# Patient Record
Sex: Male | Born: 1995 | Race: Black or African American | Hispanic: No | Marital: Single | Smoking: Current some day smoker
Health system: Southern US, Community
[De-identification: ages and names within clinical notes are randomized; demographics above are authoritative.]

## PROBLEM LIST (undated history)

## (undated) DIAGNOSIS — J45909 Unspecified asthma, uncomplicated: Secondary | ICD-10-CM

## (undated) HISTORY — DX: Unspecified asthma, uncomplicated: J45.909

---

## 2018-07-08 ENCOUNTER — Emergency Department (HOSPITAL_COMMUNITY)
Admission: EM | Admit: 2018-07-08 | Discharge: 2018-07-08 | Disposition: A | Discharge status: Home / Self Care | Payer: Self-pay | Attending: Emergency Medicine | Admitting: Emergency Medicine

## 2018-07-08 ENCOUNTER — Emergency Department (HOSPITAL_COMMUNITY): Payer: Self-pay

## 2018-07-08 ENCOUNTER — Other Ambulatory Visit: Payer: Self-pay

## 2018-07-08 DIAGNOSIS — Y92810 Car as the place of occurrence of the external cause: Secondary | ICD-10-CM | POA: Insufficient documentation

## 2018-07-08 DIAGNOSIS — W25XXXA Contact with sharp glass, initial encounter: Secondary | ICD-10-CM | POA: Insufficient documentation

## 2018-07-08 DIAGNOSIS — Y999 Unspecified external cause status: Secondary | ICD-10-CM | POA: Insufficient documentation

## 2018-07-08 DIAGNOSIS — Y9389 Activity, other specified: Secondary | ICD-10-CM | POA: Insufficient documentation

## 2018-07-08 DIAGNOSIS — S61412A Laceration without foreign body of left hand, initial encounter: Secondary | ICD-10-CM | POA: Insufficient documentation

## 2018-07-08 DIAGNOSIS — S6992XA Unspecified injury of left wrist, hand and finger(s), initial encounter: Secondary | ICD-10-CM

## 2018-07-08 MED ORDER — TETANUS-DIPHTH-ACELL PERTUSSIS 5-2.5-18.5 LF-MCG/0.5 IM SUSP
0.5000 mL | Freq: Once | INTRAMUSCULAR | Status: AC
  Administered 2018-07-08: 20:00:00 0.5 mL via INTRAMUSCULAR
  Filled 2018-07-08: qty 0.5

## 2018-07-08 NOTE — ED Notes (Signed)
Patient transported to X-ray 

## 2018-07-08 NOTE — ED Provider Notes (Signed)
[facility] Provider Note   CSN: [id number] Arrival date & time: 07/08/18  1854    History   Chief Complaint Chief Complaint  Patient presents with  . Hand Injury    HPI Bobby Moore is a 23 y.o. male with a PMH of asthma presenting with a left hand injury onset 1 hour ago. Patient reports he was locked out of car and punched the window. Patient reports a small wound and bleeding on the dorsal aspect of his left hand. Patient denies numbness, paresthesias, or weakness. Patient denies hand pain, wrist pain, or any other injuries. Patient states he is right hand dominant. Patient states his last tetanus shot was in 6th grade. Patient reports tremors and feeling anxious. Patient denies fever, chills, shortness of breath, chest pain, cough, or congestion. Patient denies sick contacts or recent travel.     HPI  No past medical history on file.  There are no active problems to display for this patient.     Home Medications    Prior to Admission medications   Not on File    Family History No family history on file.  Social History Social History   Tobacco Use  . Smoking status: Not on file  Substance Use Topics  . Alcohol use: Not on file  . Drug use: Not on file     Allergies   Patient has no allergy information on record.   Review of Systems Review of Systems  Constitutional: Negative for chills, diaphoresis and fever.  Respiratory: Negative for cough and shortness of breath.   Cardiovascular: Negative for chest pain.  Gastrointestinal: Negative for abdominal pain, nausea and vomiting.  Endocrine: Negative for cold intolerance and heat intolerance.  Musculoskeletal: Negative for arthralgias.  Skin: Positive for wound. Negative for rash.  Allergic/Immunologic: Negative for immunocompromised state.  Neurological: Positive for tremors.  Hematological: Negative for adenopathy.  Psychiatric/Behavioral: The patient is  nervous/anxious.    Physical Exam Updated Vital Signs BP 106/61   Pulse 65   Temp 98.2 F (36.8 C) (Oral)   Resp 14   SpO2 100%   Physical Exam Vitals signs and nursing note reviewed.  Constitutional:      General: He is not in acute distress.    Appearance: He is well-developed. He is not diaphoretic.  HENT:     Head: Normocephalic and atraumatic.  Neck:     Musculoskeletal: Normal range of motion.  Cardiovascular:     Rate and Rhythm: Normal rate and regular rhythm.     Heart sounds: Normal heart sounds. No murmur. No friction rub. No gallop.   Pulmonary:     Effort: Pulmonary effort is normal. No respiratory distress.     Breath sounds: Normal breath sounds. No wheezing or rales.  Abdominal:     Palpations: Abdomen is soft.     Tenderness: There is no abdominal tenderness.  Musculoskeletal: Normal range of motion.     Left elbow: Normal. He exhibits normal range of motion, no swelling and no effusion.     Left wrist: Normal. He exhibits normal range of motion, no tenderness and no bony tenderness.     Right hand: Normal. He exhibits normal range of motion, no tenderness and no bony tenderness. Normal sensation noted. Normal strength noted.     Left hand: Normal. He exhibits normal range of motion, no tenderness and no bony tenderness. Normal sensation noted. Normal strength noted.  Skin:    General: Skin is warm.  Findings: Laceration present. No erythema or rash.       Neurological:     Mental Status: He is alert.  Psychiatric:        Mood and Affect: Mood is anxious.      ED Treatments / Results  Labs (all labs ordered are listed, but only abnormal results are displayed) Labs Reviewed - No data to display  EKG None  Radiology Dg Hand Complete Left  Result Date: 07/08/2018 CLINICAL DATA:  Hand injury EXAM: LEFT HAND - COMPLETE 3+ VIEW COMPARISON:  None. FINDINGS: There is no evidence of fracture or dislocation. There is no evidence of arthropathy or  other focal bone abnormality. Soft tissues are unremarkable. IMPRESSION: Negative. Electronically Signed   By: Jasmine Pang M.D.   On: 07/08/2018 19:39    Procedures .Marland KitchenLaceration Repair Date/Time: 07/08/2018 7:57 PM Performed by: Leretha Dykes, PA-C Authorized by: Leretha Dykes, PA-C   Consent:    Consent obtained:  Verbal   Consent given by:  Patient   Risks discussed:  Infection, need for additional repair, pain, poor cosmetic result and poor wound healing   Alternatives discussed:  No treatment and delayed treatment Universal protocol:    Procedure explained and questions answered to patient or proxy's satisfaction: yes     Relevant documents present and verified: yes     Test results available and properly labeled: yes     Imaging studies available: yes     Required blood products, implants, devices, and special equipment available: yes     Site/side marked: yes     Immediately prior to procedure, a time out was called: yes     Patient identity confirmed:  Verbally with patient Anesthesia (see MAR for exact dosages):    Anesthesia method:  None Laceration details:    Length (cm):  0.5   Depth (mm):  1 Exploration:    Contaminated: no   Treatment:    Area cleansed with:  Saline and Betadine   Amount of cleaning:  Standard   Irrigation solution:  Sterile saline   Irrigation method:  Pressure wash   Visualized foreign bodies/material removed: no   Skin repair:    Repair method:  Steri-Strips   Number of Steri-Strips:  3 Approximation:    Approximation:  Close Post-procedure details:    Patient tolerance of procedure:  Tolerated well, no immediate complications   (including critical care time)  Medications Ordered in ED Medications  Tdap (BOOSTRIX) injection 0.5 mL (0.5 mLs Intramuscular Given 07/08/18 2021)     Initial Impression / Assessment and Plan / ED Course  I have reviewed the triage vital signs and the nursing notes.  Pertinent labs & imaging  results that were available during my care of the patient were reviewed by me and considered in my medical decision making (see chart for details).  Clinical Course as of Jul 08 2022  Sat Jul 08, 2018  1943 Hand x ray is negative for acute bony abnormality.  DG Hand Complete Left [AH]    Clinical Course User Index [AH] Leretha Dykes, PA-C      Patient presents after a hand injury. Pressure irrigation performed. Wound explored and base of wound visualized in a bloodless field without evidence of foreign body.  Laceration occurred < 8 hours prior to repair which was well tolerated. Patient refused sutures and wound was very small. Wound was closed with steri-strips. Tdap updated.  Pt has no comorbidities to effect normal wound healing. Pt  discharged without antibiotics.  Discussed home care with patient and answered questions. Pt to follow-up for wound check in 7 days; they are to return to the ED sooner for signs of infection. Pt is hemodynamically stable with no complaints prior to dc.    Final Clinical Impressions(s) / ED Diagnoses   Final diagnoses:  Injury of left hand, initial encounter    ED Discharge Orders    None       Leretha Dykes, [place] 07/08/18 2025    Tegeler, Canary Brim, MD 07/08/18 [phone]

## 2018-07-08 NOTE — Discharge Instructions (Signed)
You have been seen today for a hand injury. Please read and follow all provided instructions.   1. Medications: usual home medications 2. Treatment: rest, drink plenty of fluids 3. Follow Up: Please follow up with your primary doctor in 7 days for discussion of your diagnoses and further evaluation after today's visit; if you do not have a primary care doctor use the resource guide provided to find one; Please return to the ER for any new or worsening symptoms. Please obtain all of your results from medical records or have your doctors office obtain the results - share them with your doctor - you should be seen at your doctors office. Call today to arrange your follow up.   Take medications as prescribed. Please review all of the medicines and only take them if you do not have an allergy to them. Return to the emergency room for worsening condition or new concerning symptoms. Follow up with your regular doctor. If you don't have a regular doctor use one of the numbers below to establish a primary care doctor.  Please be aware that if you are taking birth control pills, taking other prescriptions, ESPECIALLY ANTIBIOTICS may make the birth control ineffective - if this is the case, either do not engage in sexual activity or use alternative methods of birth control such as condoms until you have finished the medicine and your family doctor says it is OK to restart them. If you are on a blood thinner such as COUMADIN, be aware that any other medicine that you take may cause the coumadin to either work too much, or not enough - you should have your coumadin level rechecked in next 7 days if this is the case.  ?  It is also a possibility that you have an allergic reaction to any of the medicines that you have been prescribed - Everybody reacts differently to medications and while MOST people have no trouble with most medicines, you may have a reaction such as nausea, vomiting, rash, swelling, shortness of  breath. If this is the case, please stop taking the medicine immediately and contact your physician.  ?  You should return to the ER if you develop severe or worsening symptoms.   Emergency Department Resource Guide 1) Find a Doctor and Pay Out of Pocket Although you won't have to find out who is covered by your insurance plan, it is a good idea to ask around and get recommendations. You will then need to call the office and see if the doctor you have chosen will accept you as a new patient and what types of options they offer for patients who are self-pay. Some doctors offer discounts or will set up payment plans for their patients who do not have insurance, but you will need to ask so you aren't surprised when you get to your appointment.  2) Contact Your Local Health Department Not all health departments have doctors that can see patients for sick visits, but many do, so it is worth a call to see if yours does. If you don't know where your local health department is, you can check in your phone book. The CDC also has a tool to help you locate your state's health department, and many state websites also have listings of all of their local health departments.  3) Find a Walk-in Clinic If your illness is not likely to be very severe or complicated, you may want to try a walk in clinic. These are popping up all  over the country in pharmacies, drugstores, and shopping centers. They're usually staffed by nurse practitioners or physician assistants that have been trained to treat common illnesses and complaints. They're usually fairly quick and inexpensive. However, if you have serious medical issues or chronic medical problems, these are probably not your best option.  No Primary Care Doctor: Call Health Connect at  [phone] - they can help you locate a primary care doctor that  accepts your insurance, provides certain services, etc. Physician Referral Service- [phone]  Chronic Pain  Problems: Organization         Address  Phone   Notes  [facility]  [phone] Patients need to be referred by their primary care doctor.   Medication Assistance: Organization         Address  Phone   Notes  [facility] Medication [facility] [address] [place], [place] [address] [phone] --Must be a resident of [facility] -- Must have NO insurance coverage whatsoever (no Medicaid/ Medicare, etc.) -- The pt. MUST have a primary care doctor that directs their care regularly and follows them in the community   [facility]  [phone]   [employer]  [phone]    Agencies that provide inexpensive medical care: Organization         Address  Phone   Notes  [place]  [phone]   [facility]    [phone]   [facility] [place], [place] [address] [phone]   [place] [address], [place] [phone]   [facility]    [phone]   [facility]    [phone]   [place] and [place] [address], [place] Phone:  [phone], Fax:  [phone] Hours of Operation:  9 am - 6 pm, M-F.  Also accepts Medicaid/Medicare and self-pay.  [facility] for [address], [address], [place] Phone: [phone], Fax: [phone]. Hours of Operation:  8:30 am - 5:30 pm, M-F.  Also accepts Medicaid and self-pay.  [facility] [place] [address], [place] Phone: [phone]   [place], [place], [place] [phone], Ext. [phone] Mondays & Thursdays: 7-9 AM.  First 15 patients are seen on a first come, first serve basis.    [place] Providers:  Organization         Address  Phone   Notes  [facility] [address], [place] [phone] Also  accepts self-pay patients.  [facility] [address], [place]  [phone]   [place], [address], [place] [phone]   [facility] [address] [place] [phone]   Lucianne Lei [address], [place]   [phone] Only accepts [place] patients after they have their name applied to their card.   Self-Pay (no insurance) in [facility]:  Organization         Address  Phone   Notes  Sickle Cell Patients, [facility] [place] [phone]   [facility] [place] [phone]   [facility] [place]  [address], [place] [phone]   °[facility]/Dr. Osei-Bonsu ° [address] [place] [address], [place] or [address], [place] [phone] Phone number for both [place] and [place] locations is the same.  °[facility] [address], [place] [phone]   °[facility] [address] [place] [address], [place] or [address] [phone] °[phone]   °[facility] [address],  [phone], phone; [phone], fax Sees patients 1st and 3rd Saturday of every month.  Must not qualify for public or private insurance (i.e. Medicaid, Medicare, [place] Health Choice, Veterans' Benefits)  Household income should be no more than 200% of the poverty level The clinic cannot treat you if you are pregnant or think you are pregnant  Sexually transmitted diseases are not treated at the clinic.  °  ° °

## 2018-07-08 NOTE — ED Triage Notes (Addendum)
Patient attempted to get locked keys out of car. Patient has a small cut on his left hand. Patient is lethargic and denies drug use.

## 2018-07-11 ENCOUNTER — Ambulatory Visit (HOSPITAL_COMMUNITY)
Admission: EM | Admit: 2018-07-11 | Discharge: 2018-07-11 | Disposition: A | Discharge status: Home / Self Care | Payer: Self-pay | Attending: Family Medicine | Admitting: Family Medicine

## 2018-07-11 ENCOUNTER — Encounter (HOSPITAL_COMMUNITY): Payer: Self-pay

## 2018-07-11 ENCOUNTER — Other Ambulatory Visit: Payer: Self-pay

## 2018-07-11 DIAGNOSIS — B349 Viral infection, unspecified: Secondary | ICD-10-CM

## 2018-07-11 MED ORDER — ACETAMINOPHEN 325 MG PO TABS
975.0000 mg | ORAL_TABLET | Freq: Once | ORAL | Status: AC
  Administered 2018-07-11: 975 mg via ORAL

## 2018-07-11 MED ORDER — ACETAMINOPHEN 325 MG PO TABS
ORAL_TABLET | ORAL | Status: AC
  Filled 2018-07-11: qty 3

## 2018-07-11 NOTE — Discharge Instructions (Addendum)
This is most likely some sort of viral illness We cannot rule out COVID at this time. We will treat this as COVID and have you quarantine for the next 7 days until you are fever free and symptom free If you start developing any shortness of breath or worsening symptoms please go to the ER You can take Tylenol or ibuprofen for fever and body aches

## 2018-07-11 NOTE — ED Provider Notes (Signed)
[facility]    CSN: [id number] Arrival date & time: 07/11/18  1029     History   Chief Complaint No chief complaint on file.   HPI Bobby Moore is a 23 y.o. male.   Patient is a 23 year old male who presents today with fever, body aches that started last night.  Symptoms have been constant.  He has not take anything for the symptoms.  He denies any associated cough, congestion, nasal congestion, rhinorrhea, sore throat.  Denies any recent sick contacts or recent traveling.  He was in the ER approximately 3 days ago for hand injury.  Hand is healing appropriately.  No swelling, erythema or drainage.  ROS per HPI      History reviewed. No pertinent past medical history.  There are no active problems to display for this patient.   History reviewed. No pertinent surgical history.     Home Medications    Prior to Admission medications   Not on File    Family History Family History  Family history unknown: Yes    Social History Social History   Tobacco Use  . Smoking status: Current Some Day Smoker  . Smokeless tobacco: Never Used  Substance Use Topics  . Alcohol use: Not on file  . Drug use: Not on file     Allergies   Patient has no allergy information on record.   Review of Systems Review of Systems   Physical Exam Triage Vital Signs ED Triage Vitals [07/11/18 1045]  Enc Vitals Group     BP 94/66     Pulse Rate (!) 105     Resp (!) 22     Temp (!) 102.7 F (39.3 C)     Temp src      SpO2 96 %     Weight      Height      Head Circumference      Peak Flow      Pain Score 7     Pain Loc      Pain Edu?      Excl. in GC?    No data found.  Updated Vital Signs BP 110/67   Pulse 92   Temp (!) 102.7 F (39.3 C)   Resp (!) 22   SpO2 96%   Visual Acuity Right Eye Distance:   Left Eye Distance:   Bilateral Distance:    Right Eye Near:   Left Eye Near:    Bilateral Near:     Physical Exam Vitals signs and nursing  note reviewed.  Constitutional:      Appearance: He is ill-appearing. He is not toxic-appearing.     Comments: Pt very lethargic and had to keep arousing to assess And have him answer my questions.   HENT:     Head: Normocephalic and atraumatic.     Nose: Nose normal.     Mouth/Throat:     Pharynx: Oropharynx is clear. Posterior oropharyngeal erythema present.  Eyes:     Conjunctiva/sclera: Conjunctivae normal.  Neck:     Musculoskeletal: Normal range of motion.  Cardiovascular:     Rate and Rhythm: Normal rate and regular rhythm.  Pulmonary:     Effort: Pulmonary effort is normal.     Breath sounds: Normal breath sounds.  Musculoskeletal: Normal range of motion.  Skin:    General: Skin is warm and dry.  Neurological:     Mental Status: He is alert.  Psychiatric:  Mood and Affect: Mood normal.      UC Treatments / Results  Labs (all labs ordered are listed, but only abnormal results are displayed) Labs Reviewed - No data to display  EKG None  Radiology No results found.  Procedures Procedures (including critical care time)  Medications Ordered in UC Medications  acetaminophen (TYLENOL) tablet 975 mg (975 mg Oral Given 07/11/18 1119)    Initial Impression / Assessment and Plan / UC Course  I have reviewed the triage vital signs and the nursing notes.  Pertinent labs & imaging results that were available during my care of the patient were reviewed by me and considered in my medical decision making (see chart for details).     Most likely viral illness My suspicion is for COVID at this time Instructed pt to quarantine for 7 days or until fever and symptoms free He does not work.  He can take tylenol or ibuprofen for the symptoms and if he worsens he will need to go to the ER.  Pt understanding and agrees. Final Clinical Impressions(s) / UC Diagnoses   Final diagnoses:  Viral illness     Discharge Instructions     This is most likely some sort  of viral illness We cannot rule out COVID at this time. We will treat this as COVID and have you quarantine for the next 7 days until you are fever free and symptom free If you start developing any shortness of breath or worsening symptoms please go to the ER You can take Tylenol or ibuprofen for fever and body aches    ED Prescriptions    None     Controlled Substance Prescriptions [place] Controlled Substance Registry consulted? Not Applicable   Janace ArisBast, Halo Laski A, NP 07/11/18 1212

## 2018-07-11 NOTE — ED Triage Notes (Signed)
Pt states he began having flu like symptoms last night with body aches and fever , pt is lethargic in is difficult to get him to answer questions

## 2020-05-14 IMAGING — DX LEFT HAND - COMPLETE 3+ VIEW
3 series · 3 of 3 positions shown · non-contrast
Comparison: None.

CLINICAL DATA: Hand injury

EXAM:
LEFT HAND - COMPLETE 3+ VIEW

[hand pa]
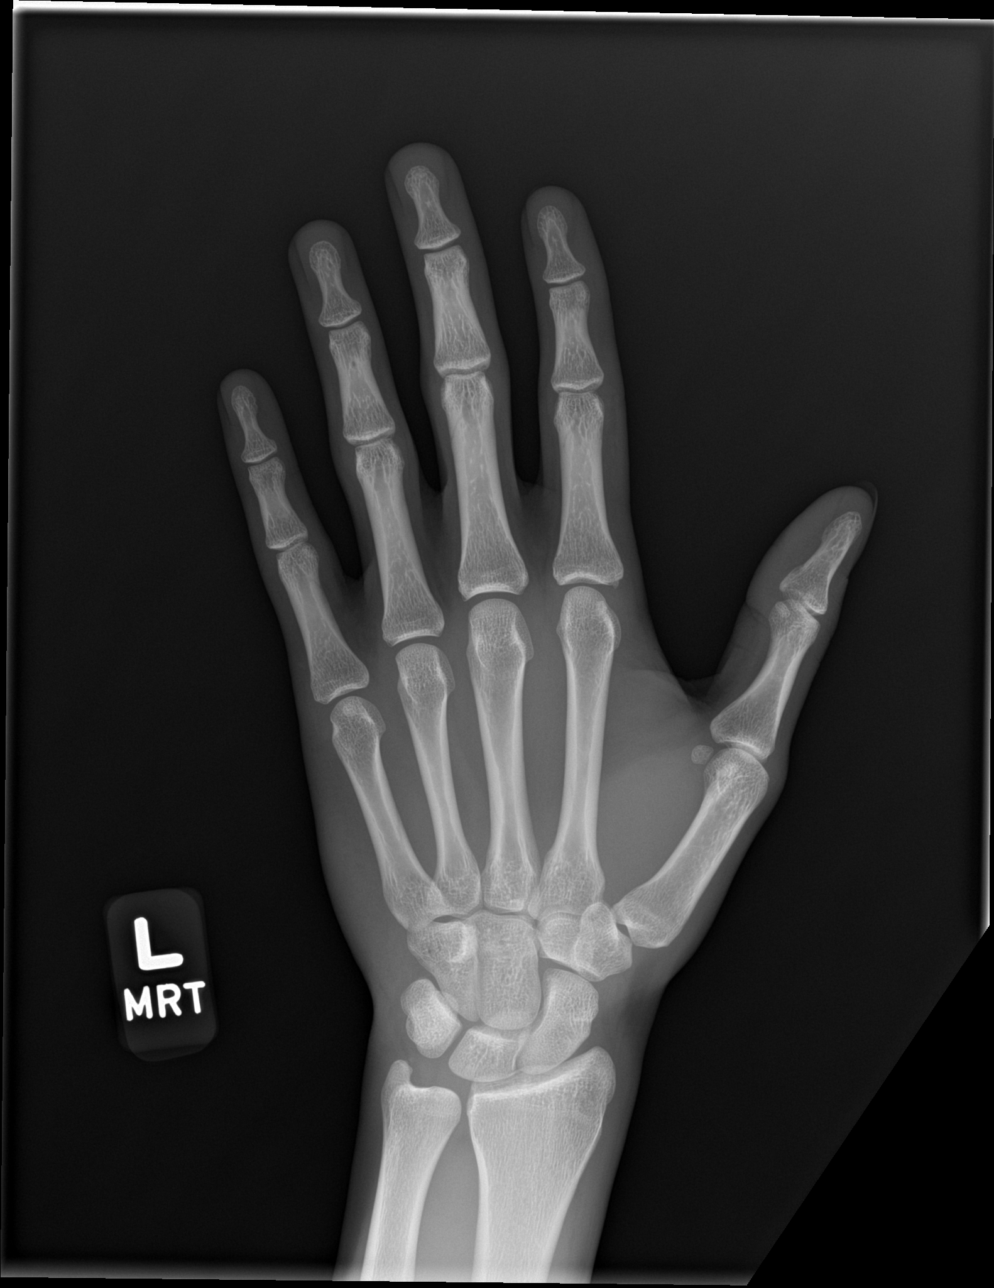

[hand obl]
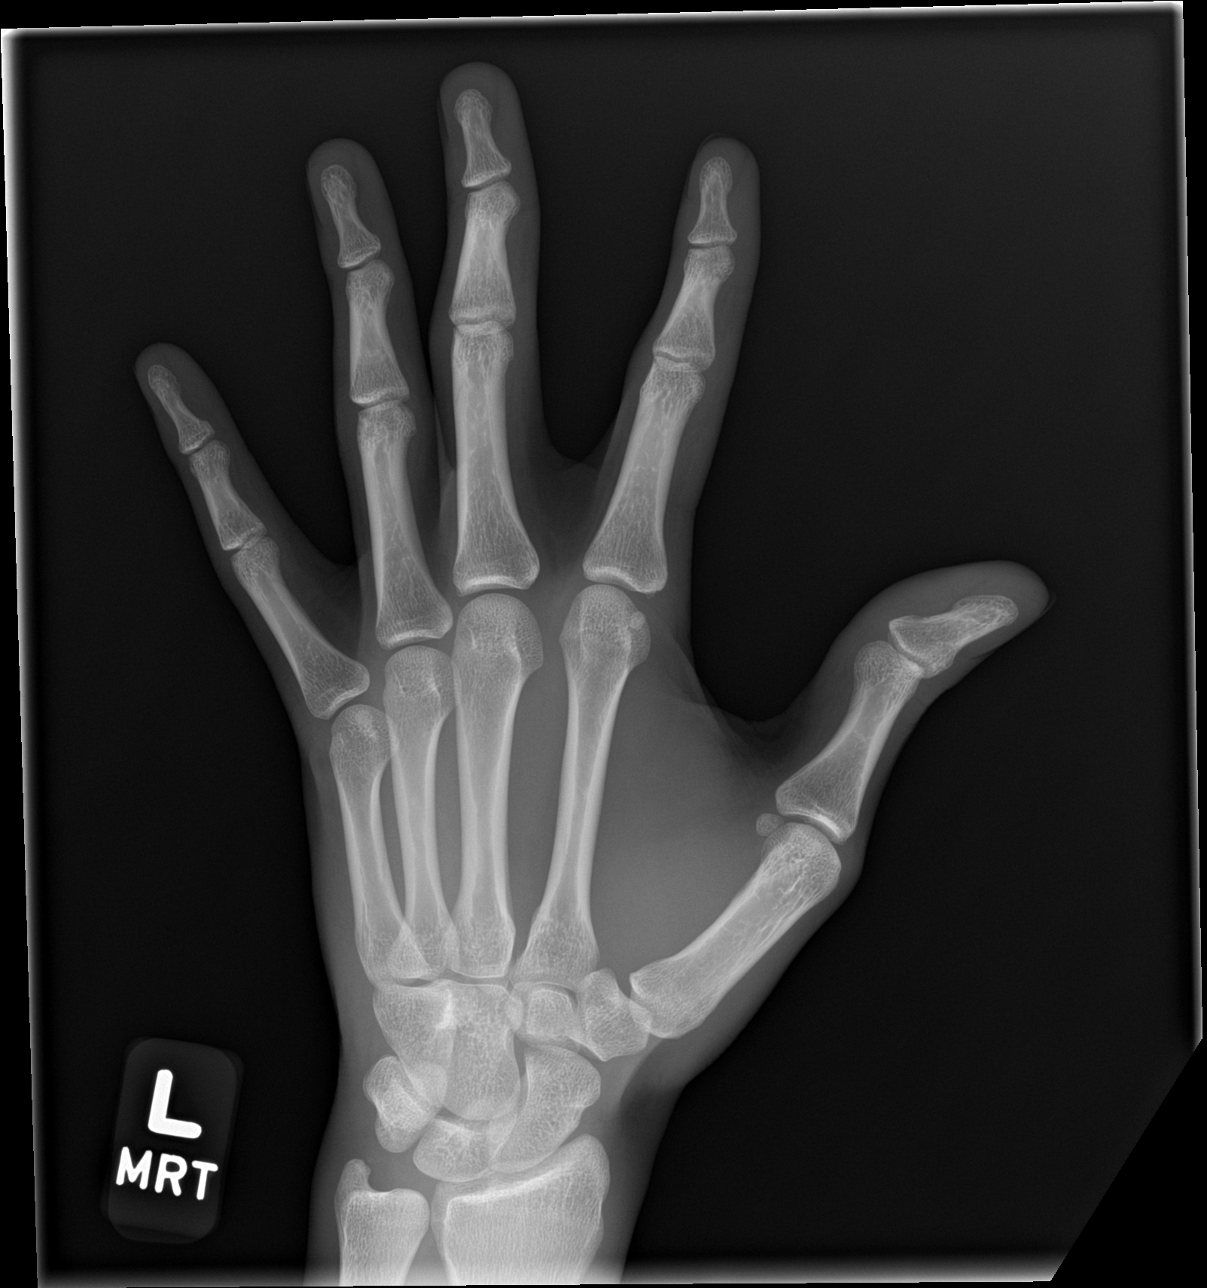

[hand lat]
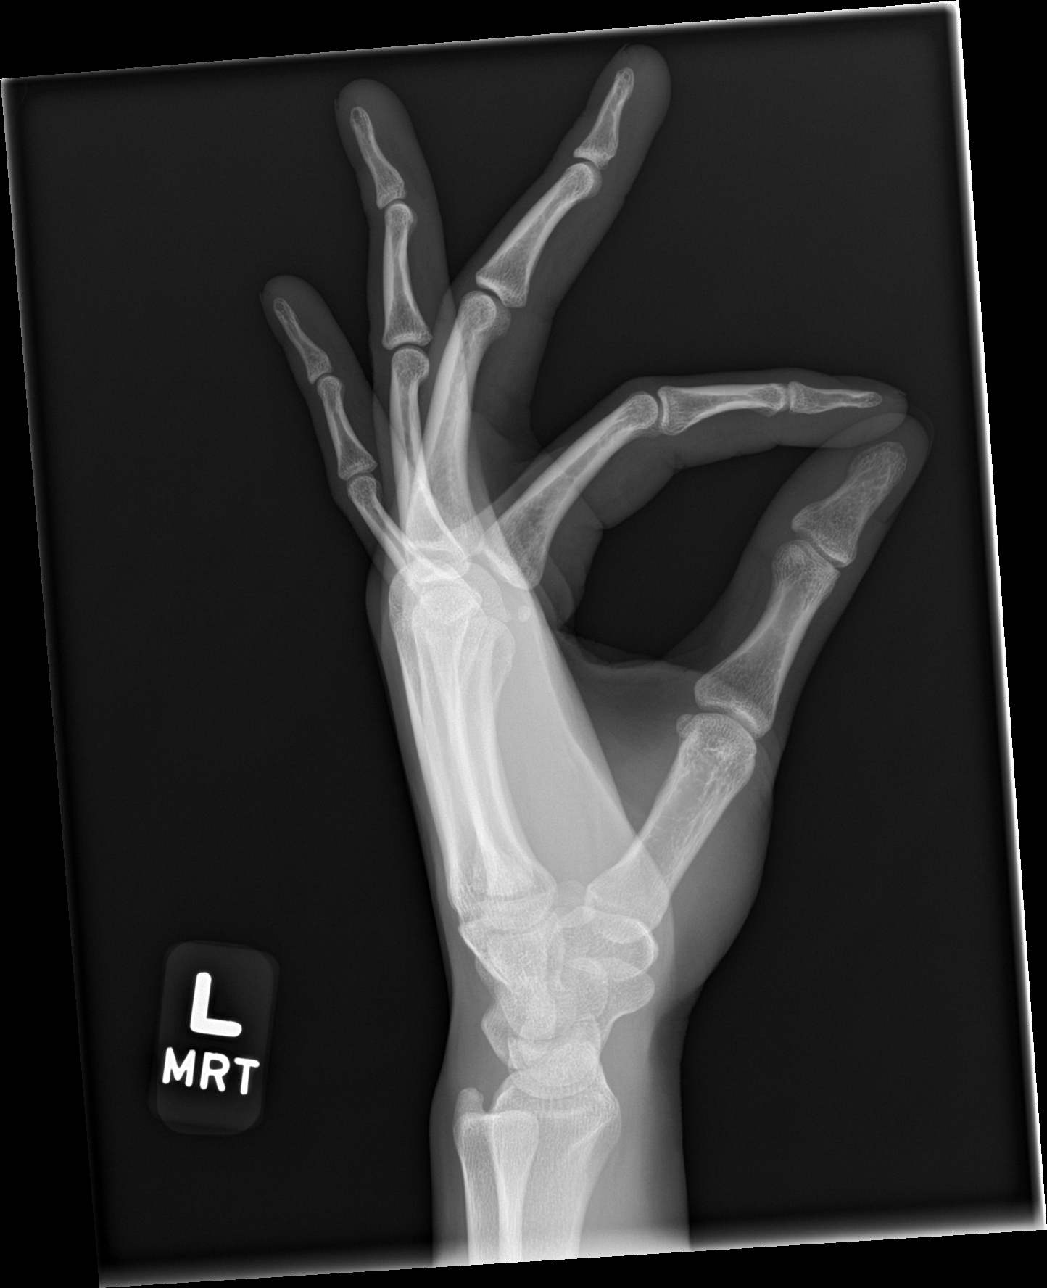

[3 of 3 positions shown; findings below may reference images not displayed]

FINDINGS: There is no evidence of fracture or dislocation. There is no
evidence of arthropathy or other focal bone abnormality. Soft
tissues are unremarkable.
IMPRESSION: Negative.

## 2022-12-12 ENCOUNTER — Emergency Department (HOSPITAL_COMMUNITY)
Admission: EM | Admit: 2022-12-12 | Discharge: 2022-12-12 | Disposition: A | Discharge status: Home / Self Care | Payer: Self-pay | Attending: Emergency Medicine | Admitting: Emergency Medicine

## 2022-12-12 ENCOUNTER — Other Ambulatory Visit: Payer: Self-pay

## 2022-12-12 DIAGNOSIS — K0889 Other specified disorders of teeth and supporting structures: Secondary | ICD-10-CM | POA: Insufficient documentation

## 2022-12-12 MED ORDER — OXYCODONE-ACETAMINOPHEN 5-325 MG PO TABS
1.0000 | ORAL_TABLET | Freq: Once | ORAL | Status: AC
  Administered 2022-12-12: 1 via ORAL
  Filled 2022-12-12: qty 1

## 2022-12-12 MED ORDER — NYSTATIN 100000 UNIT/ML MT SUSP: 5.0000 mL | Freq: Three times a day (TID) | OROMUCOSAL | 0 refills | Status: AC

## 2022-12-12 NOTE — Discharge Instructions (Signed)
It was a pleasure taking part in your care today.  As we discussed, you will need to see a dentist for further management and care for dental pain.  For your pain, please take ibuprofen 600 mg and Tylenol 60 mg combined.  You may do this every 6 hours.  Please do not do this for more than 3 days.  You may also purchase Orajel and place it on painful areas in your mouth.  I am prescribing you a Magic mouthwash, please use this 3 times a day by swishing and spitting.  Please utilize the attached resource guide to follow-up with dentist in the area.  Please read the attached guide concerning dental pain.  Please return to the ED with any new or worsening symptom such as fevers.

## 2022-12-12 NOTE — ED Provider Notes (Signed)
[place] EMERGENCY DEPARTMENT AT [facility] Provider Note   CSN: [id number] Arrival date & time: 12/12/22  0456     History  Chief Complaint  Patient presents with   Dental Pain    Bobby Moore is a 27 y.o. male with no documented medical history.  Patient presents to ED for evaluation of right upper dental pain.  Patient states that he has had right upper dental pain for "months".  States that he has not been able to see dentistry in quite some time secondary to not having insurance.  States that he recently got on his father's insurance he is actively in the process of seeking a Education officer, community.  States that he has tried taking ibuprofen a few times over the last months however pain not fully treated.  Patient states that pain was so significant last night it was giving him a headache.  He denies any fevers, nausea vomiting, drooling or change phonation.  Denies facial swelling.   Dental Pain      Home Medications Prior to Admission medications   Medication Sig Start Date End Date Taking? Authorizing Provider  magic mouthwash (nystatin, lidocaine, diphenhydrAMINE, alum & mag hydroxide) suspension Swish and spit 5 mLs 3 (three) times daily. 12/12/22  Yes Al Decant, PA-C      Allergies    Patient has no known allergies.    Review of Systems   Review of Systems  HENT:  Positive for dental problem.   All other systems reviewed and are negative.   Physical Exam Updated Vital Signs BP (!) 110/55   Pulse (!) 50   Temp 97.7 F (36.5 C) (Oral)   Resp 16   SpO2 100%  Physical Exam Vitals and nursing note reviewed.  Constitutional:      General: He is not in acute distress.    Appearance: He is well-developed.  HENT:     Head: Normocephalic and atraumatic.     Mouth/Throat:     Comments: Extensive dental caries throughout.  No abscess needing drainage.  Uvula midline and handling secretions appropriately. Eyes:     Conjunctiva/sclera: Conjunctivae  normal.  Cardiovascular:     Rate and Rhythm: Normal rate and regular rhythm.     Heart sounds: No murmur heard. Pulmonary:     Effort: Pulmonary effort is normal. No respiratory distress.     Breath sounds: Normal breath sounds.  Abdominal:     Palpations: Abdomen is soft.     Tenderness: There is no abdominal tenderness.  Musculoskeletal:        General: No swelling.     Cervical back: Neck supple.  Skin:    General: Skin is warm and dry.     Capillary Refill: Capillary refill takes less than 2 seconds.  Neurological:     Mental Status: He is alert.  Psychiatric:        Mood and Affect: Mood normal.     ED Results / Procedures / Treatments   Labs (all labs ordered are listed, but only abnormal results are displayed) Labs Reviewed - No data to display  EKG None  Radiology No results found.  Procedures Procedures   Medications Ordered in ED Medications  oxyCODONE-acetaminophen (PERCOCET/ROXICET) 5-325 MG per tablet 1 tablet (1 tablet Oral Given 12/12/22 0509)    ED Course/ Medical Decision Making/ A&P  Medical Decision Making Risk Prescription drug management.   27 year old male presents to the ED for evaluation.  Please see HPI for further details.  On examination patient is afebrile and nontachycardic.  Lung sounds are clear bilaterally, not hypoxic.  Abdomen is soft and compressible throughout.  Neurological examination baseline.  Patient has extensive dental caries throughout but no obvious abscess negative drainage.  No facial swelling.  His uvula is midline, he is handling secretions appropriately.  He is overall nontoxic in appearance.  Patient was provided oxycodone tablet in triage.  He reports that his pain is better controlled at this time.  I explained to the patient that we are unable to provide him narcotic pain medication prescriptions to the ED for dental pain.  I have encouraged him to utilize ibuprofen and Tylenol at home.  Have encouraged him to  purchase Orajel and will prescribe him Magic mouthwash.  Will also send him home with list of dental resources in the area to follow-up with.  He has been advised to return to the ED with any new or worsening symptoms such as fevers, inability to swallow and he voiced understanding.  Stable to discharge.   Final Clinical Impression(s) / ED Diagnoses Final diagnoses:  Pain, dental    Rx / DC Orders ED Discharge Orders          Ordered    magic mouthwash (nystatin, lidocaine, diphenhydrAMINE, alum & mag hydroxide) suspension  3 times daily        12/12/22 0822              Al Decant, PA-C 12/12/22 1610    Jacalyn Lefevre, MD 12/12/22 1601

## 2022-12-12 NOTE — ED Triage Notes (Signed)
Patient reports chronic right upper molar pain for several months unrelieved by OTC pain medications.

## 2022-12-13 ENCOUNTER — Emergency Department (HOSPITAL_COMMUNITY): Payer: Self-pay

## 2022-12-13 ENCOUNTER — Emergency Department (HOSPITAL_COMMUNITY)
Admission: EM | Admit: 2022-12-13 | Discharge: 2022-12-13 | Disposition: A | Discharge status: Home / Self Care | Payer: Self-pay | Attending: Emergency Medicine | Admitting: Emergency Medicine

## 2022-12-13 ENCOUNTER — Encounter (HOSPITAL_COMMUNITY): Payer: Self-pay

## 2022-12-13 ENCOUNTER — Other Ambulatory Visit: Payer: Self-pay

## 2022-12-13 DIAGNOSIS — K047 Periapical abscess without sinus: Secondary | ICD-10-CM | POA: Insufficient documentation

## 2022-12-13 LAB — CBC WITH DIFFERENTIAL/PLATELET
Abs Immature Granulocytes: 0.02 10*3/uL (ref 0.00–0.07)
Basophils Absolute: 0 10*3/uL (ref 0.0–0.1)
Basophils Relative: 0 %
Eosinophils Absolute: 0.1 10*3/uL (ref 0.0–0.5)
Eosinophils Relative: 1 %
HCT: 39.2 % (ref 39.0–52.0)
Hemoglobin: 12.3 g/dL — ABNORMAL LOW (ref 13.0–17.0)
Immature Granulocytes: 0 %
Lymphocytes Relative: 24 %
Lymphs Abs: 1.4 10*3/uL (ref 0.7–4.0)
MCH: 26.4 pg (ref 26.0–34.0)
MCHC: 31.4 g/dL (ref 30.0–36.0)
MCV: 84.1 fL (ref 80.0–100.0)
Monocytes Absolute: 0.9 10*3/uL (ref 0.1–1.0)
Monocytes Relative: 15 %
Neutro Abs: 3.4 10*3/uL (ref 1.7–7.7)
Neutrophils Relative %: 60 %
Platelets: 204 10*3/uL (ref 150–400)
RBC: 4.66 MIL/uL (ref 4.22–5.81)
RDW: 12.5 % (ref 11.5–15.5)
WBC: 5.8 10*3/uL (ref 4.0–10.5)
nRBC: 0 % (ref 0.0–0.2)

## 2022-12-13 LAB — BASIC METABOLIC PANEL
Anion gap: 9 (ref 5–15)
BUN: 10 mg/dL (ref 6–20)
CO2: 27 mmol/L (ref 22–32)
Calcium: 9.1 mg/dL (ref 8.9–10.3)
Chloride: 101 mmol/L (ref 98–111)
Creatinine, Ser: 1.27 mg/dL — ABNORMAL HIGH (ref 0.61–1.24)
GFR, Estimated: >60 mL/min (ref >60)
Glucose, Bld: 95 mg/dL (ref 70–99)
Potassium: 3.7 mmol/L (ref 3.5–5.1)
Sodium: 137 mmol/L (ref 135–145)

## 2022-12-13 MED ORDER — METOCLOPRAMIDE HCL 5 MG/ML IJ SOLN
10.0000 mg | Freq: Once | INTRAMUSCULAR | Status: AC
  Administered 2022-12-13: 10 mg via INTRAVENOUS
  Filled 2022-12-13: qty 2

## 2022-12-13 MED ORDER — DIPHENHYDRAMINE HCL 50 MG/ML IJ SOLN
25.0000 mg | Freq: Once | INTRAMUSCULAR | Status: AC
  Administered 2022-12-13: 25 mg via INTRAVENOUS
  Filled 2022-12-13: qty 1

## 2022-12-13 MED ORDER — KETOROLAC TROMETHAMINE 15 MG/ML IJ SOLN
15.0000 mg | Freq: Once | INTRAMUSCULAR | Status: AC
  Administered 2022-12-13: 15 mg via INTRAVENOUS
  Filled 2022-12-13: qty 1

## 2022-12-13 MED ORDER — AMOXICILLIN-POT CLAVULANATE 875-125 MG PO TABS: 1.0000 | ORAL_TABLET | Freq: Two times a day (BID) | ORAL | 0 refills | Status: AC

## 2022-12-13 MED ORDER — AMOXICILLIN-POT CLAVULANATE 875-125 MG PO TABS
1.0000 | ORAL_TABLET | Freq: Once | ORAL | Status: AC
  Administered 2022-12-13: 1 via ORAL
  Filled 2022-12-13: qty 1

## 2022-12-13 MED ORDER — IOHEXOL 350 MG/ML SOLN
75.0000 mL | Freq: Once | INTRAVENOUS | Status: AC | PRN
  Administered 2022-12-13: 75 mL via INTRAVENOUS

## 2022-12-13 MED ORDER — SODIUM CHLORIDE 0.9 % IV BOLUS
1000.0000 mL | Freq: Once | INTRAVENOUS | Status: AC
  Administered 2022-12-13: 1000 mL via INTRAVENOUS

## 2022-12-13 NOTE — ED Provider Notes (Signed)
[place] EMERGENCY DEPARTMENT AT [facility] Provider Note   CSN: [id number] Arrival date & time: 12/13/22  4098     History  Chief Complaint  Patient presents with   Headache    Bobby Moore is a 27 y.o. male.  HPI 27 year old male presents with facial swelling and headache.  He states he has been dealing with right-sided headaches nearly every day for 5 months or so.  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  Headache is usually worse in the morning and there will be times when it feels like it is squeezing.  Is always on the right side.  During the same time he is also been dealing with a hole in his gum on the right side.  There is 1 incisor on the right maxillary side that seems to also have some discomfort.  He was in the emergency department just over 24 hours ago and discharged with Magic mouthwash.  However this morning he woke up and had right-sided facial swelling.  No fevers, vision complaints, or weakness and or numbness in his extremities.  Home Medications Prior to Admission medications   Medication Sig Start Date End Date Taking? Authorizing Provider  amoxicillin-clavulanate (AUGMENTIN) 875-125 MG tablet Take 1 tablet by mouth every 12 (twelve) hours. 12/13/22  Yes Pricilla Loveless, MD  magic mouthwash (nystatin, lidocaine, diphenhydrAMINE, alum & mag hydroxide) suspension Swish and spit 5 mLs 3 (three) times daily. 12/12/22   Al Decant, PA-C      Allergies    Patient has no known allergies.    Review of Systems   Review of Systems  Constitutional:  Negative for fever.  HENT:  Positive for dental problem and facial swelling.   Eyes:  Negative for visual disturbance.  Gastrointestinal:  Negative for vomiting.  Neurological:  Positive for headaches. Negative for weakness and numbness.    Physical Exam Updated Vital Signs BP 119/67 (BP Location: Right Arm)   Pulse 60   Temp 98.2 F (36.8 C) (Oral)    Resp 18   Ht 6\' 1"  (1.854 m)   Wt 74.8 kg   SpO2 98%   BMI 21.77 kg/m  Physical Exam Vitals and nursing note reviewed.  Constitutional:      Appearance: He is well-developed.  HENT:     Head: Normocephalic and atraumatic.      Mouth/Throat:     Comments: No clear dental abscess. On right maxillary inner cheek there is no drainage or obvious fluctuance. Eyes:     Extraocular Movements: Extraocular movements intact.     Pupils: Pupils are equal, round, and reactive to light.  Cardiovascular:     Rate and Rhythm: Normal rate and regular rhythm.     Heart sounds: Normal heart sounds.  Pulmonary:     Effort: Pulmonary effort is normal.     Breath sounds: Normal breath sounds.  Abdominal:     General: There is no distension.  Musculoskeletal:     Cervical back: Neck supple. No rigidity.  Skin:    General: Skin is warm and dry.  Neurological:     Mental Status: He is alert.     Comments: CN 3-12 grossly intact. 5/5 strength in all 4 extremities. Grossly normal sensation. Normal finger to nose.      ED Results / Procedures / Treatments   Labs (all labs ordered are listed, but only abnormal results are displayed) Labs Reviewed  BASIC METABOLIC PANEL - Abnormal;  Notable for the following components:      Result Value   Creatinine, Ser 1.27 (*)    All other components within normal limits  CBC WITH DIFFERENTIAL/PLATELET - Abnormal; Notable for the following components:   Hemoglobin 12.3 (*)    All other components within normal limits    EKG None  Radiology CT Maxillofacial W Contrast  Result Date: 12/13/2022 CLINICAL DATA:  Facial abscess. EXAM: CT MAXILLOFACIAL WITH CONTRAST TECHNIQUE: Multidetector CT imaging of the maxillofacial structures was performed with intravenous contrast. Multiplanar CT image reconstructions were also generated. RADIATION DOSE REDUCTION: This exam was performed according to the departmental dose-optimization program which includes automated  exposure control, adjustment of the mA and/or kV according to patient size and/or use of iterative reconstruction technique. CONTRAST:  75mL OMNIPAQUE IOHEXOL 350 MG/ML SOLN COMPARISON:  None Available. FINDINGS: Osseous: No fracture or mandibular dislocation. Orbits: Negative. No traumatic or inflammatory finding. Sinuses: Mucosal thickening bilateral maxillary left sphenoid sinus. No middle ear or mastoid effusion. Soft tissues: There is asymmetric soft tissue swelling maxillary soft tissues on the right (series 3, image 50). There is a large dental carie with a periapical lucency in this region (series 5, image 59). There is phlegmonous change abutting the right second molar. No discrete drainable fluid collection is visualized. Limited intracranial: No significant or unexpected finding. IMPRESSION: Asymmetric soft tissue swelling of the right premaxillary soft tissues, compatible with cellulitis. There is phlegmonous change abutting the right maxillary second molar without a discrete drainable odontogenic soft tissue abscess. Electronically Signed   By: Lorenza Cambridge M.D.   On: 12/13/2022 13:30   CT Head Wo Contrast  Result Date: 12/13/2022 CLINICAL DATA:  Headache, increasing frequency or severity EXAM: CT HEAD WITHOUT CONTRAST TECHNIQUE: Contiguous axial images were obtained from the base of the skull through the vertex without intravenous contrast. RADIATION DOSE REDUCTION: This exam was performed according to the departmental dose-optimization program which includes automated exposure control, adjustment of the mA and/or kV according to patient size and/or use of iterative reconstruction technique. COMPARISON:  None Available. FINDINGS: Brain: No hemorrhage. No hydrocephalus. No extra-axial fluid collection. No CT evidence of cortical infarct. No extra-axial fluid collection. No mass effect. No mass lesion. Vascular: No hyperdense vessel or unexpected calcification. Skull: Normal. Negative for fracture  or focal lesion. Sinuses/Orbits: No middle ear or mastoid effusion. Mucosal thickening bilateral maxillary sinuses, left-greater-than-right. Mild mucosal thickening is also present left sphenoid sinus. Orbits are unremarkable. Other: None. IMPRESSION: No acute intracranial abnormality. Electronically Signed   By: Lorenza Cambridge M.D.   On: 12/13/2022 13:26    Procedures Procedures    Medications Ordered in ED Medications  sodium chloride 0.9 % bolus 1,000 mL (0 mLs Intravenous Stopped 12/13/22 1317)  metoCLOPramide (REGLAN) injection 10 mg (10 mg Intravenous Given 12/13/22 1045)  diphenhydrAMINE (BENADRYL) injection 25 mg (25 mg Intravenous Given 12/13/22 1045)  ketorolac (TORADOL) 15 MG/ML injection 15 mg (15 mg Intravenous Given 12/13/22 1045)  iohexol (OMNIPAQUE) 350 MG/ML injection 75 mL (75 mLs Intravenous Contrast Given 12/13/22 1200)  amoxicillin-clavulanate (AUGMENTIN) 875-125 MG per tablet 1 tablet (1 tablet Oral Given 12/13/22 1422)    ED Course/ Medical Decision Making/ A&P                                 Medical Decision Making Amount and/or Complexity of Data Reviewed Labs: ordered.    Details: Normal dub ABC Radiology: ordered and  independent interpretation performed.    Details: No head bleed or mass.  No obvious facial abscess  Risk Prescription drug management.   Headache and pain has resolved with treatment of headache cocktail with Toradol.  Is feeling better.  He does not have any signs of airway compromise.  Appears to have a dental infection though no obvious abscess.  I cannot visibly see 1 so I do not think I&D is needed.  He has dental follow-up in 2 days.  Will start him on Augmentin and recommend continued OTC meds such as ibuprofen and Tylenol.  Appears stable for discharge home with return precautions.  Unclear if the chronic headache is related to the dental problems or something else but his neuroexam is unremarkable and CT head is unremarkable.  Will discharge  home with return precautions.        Final Clinical Impression(s) / ED Diagnoses Final diagnoses:  Dental infection    Rx / DC Orders ED Discharge Orders          Ordered    amoxicillin-clavulanate (AUGMENTIN) 875-125 MG tablet  Every 12 hours        12/13/22 1411              Pricilla Loveless, MD 12/13/22 1609

## 2022-12-13 NOTE — Discharge Instructions (Signed)
We are treating you for a dental infection with 7 days of antibiotics.  You should also take ibuprofen and/or Tylenol to help with pain, discomfort, and swelling.  Follow-up with the dentist as scheduled in 2 days.  Follow-up with your primary care physician.  If you do not have a primary care physician then you will need to find 1 for outpatient follow-up and primary/preventative care.  If you develop new or worsening headache, facial swelling, pain, difficulty swallowing or speaking, trouble breathing, or any other new/concerning symptoms then return to the ER or call 911.

## 2022-12-13 NOTE — ED Triage Notes (Signed)
Pt c/o HA and swelling of right side of face started today. Pt states he has a hole in his gum on right side of mouth. Pt has 2+ swelling of right side of face. Pt states he has a dentist appt on Wed

## 2022-12-13 NOTE — ED Notes (Signed)
Patient transported to CT
